# Patient Record
Sex: Male | Born: 1990 | Hispanic: No | Smoking: Current every day smoker
Health system: Southern US, Community
[De-identification: ages and names within clinical notes are randomized; demographics above are authoritative.]

---

## 2016-08-02 ENCOUNTER — Emergency Department (HOSPITAL_COMMUNITY): Payer: Self-pay

## 2016-08-02 ENCOUNTER — Encounter (HOSPITAL_COMMUNITY): Payer: Self-pay | Admitting: Emergency Medicine

## 2016-08-02 ENCOUNTER — Emergency Department (HOSPITAL_COMMUNITY)
Admission: EM | Admit: 2016-08-02 | Discharge: 2016-08-03 | Disposition: A | Discharge status: Home / Self Care | Payer: Self-pay | Attending: Emergency Medicine | Admitting: Emergency Medicine

## 2016-08-02 DIAGNOSIS — W51XXXA Accidental striking against or bumped into by another person, initial encounter: Secondary | ICD-10-CM | POA: Insufficient documentation

## 2016-08-02 DIAGNOSIS — Y999 Unspecified external cause status: Secondary | ICD-10-CM | POA: Insufficient documentation

## 2016-08-02 DIAGNOSIS — F17200 Nicotine dependence, unspecified, uncomplicated: Secondary | ICD-10-CM | POA: Insufficient documentation

## 2016-08-02 DIAGNOSIS — Y92322 Soccer field as the place of occurrence of the external cause: Secondary | ICD-10-CM | POA: Insufficient documentation

## 2016-08-02 DIAGNOSIS — S42022A Displaced fracture of shaft of left clavicle, initial encounter for closed fracture: Secondary | ICD-10-CM | POA: Insufficient documentation

## 2016-08-02 DIAGNOSIS — Y9366 Activity, soccer: Secondary | ICD-10-CM | POA: Insufficient documentation

## 2016-08-02 DIAGNOSIS — S42002A Fracture of unspecified part of left clavicle, initial encounter for closed fracture: Secondary | ICD-10-CM

## 2016-08-02 IMAGING — DX DG CLAVICLE*L*
2 series · 2 of 2 positions shown · non-contrast
Comparison: None.

CLINICAL DATA: Fell today while playing soccer. Pain in the left
collarbone.

EXAM:
LEFT CLAVICLE - 2+ VIEWS

[clavicle ap]
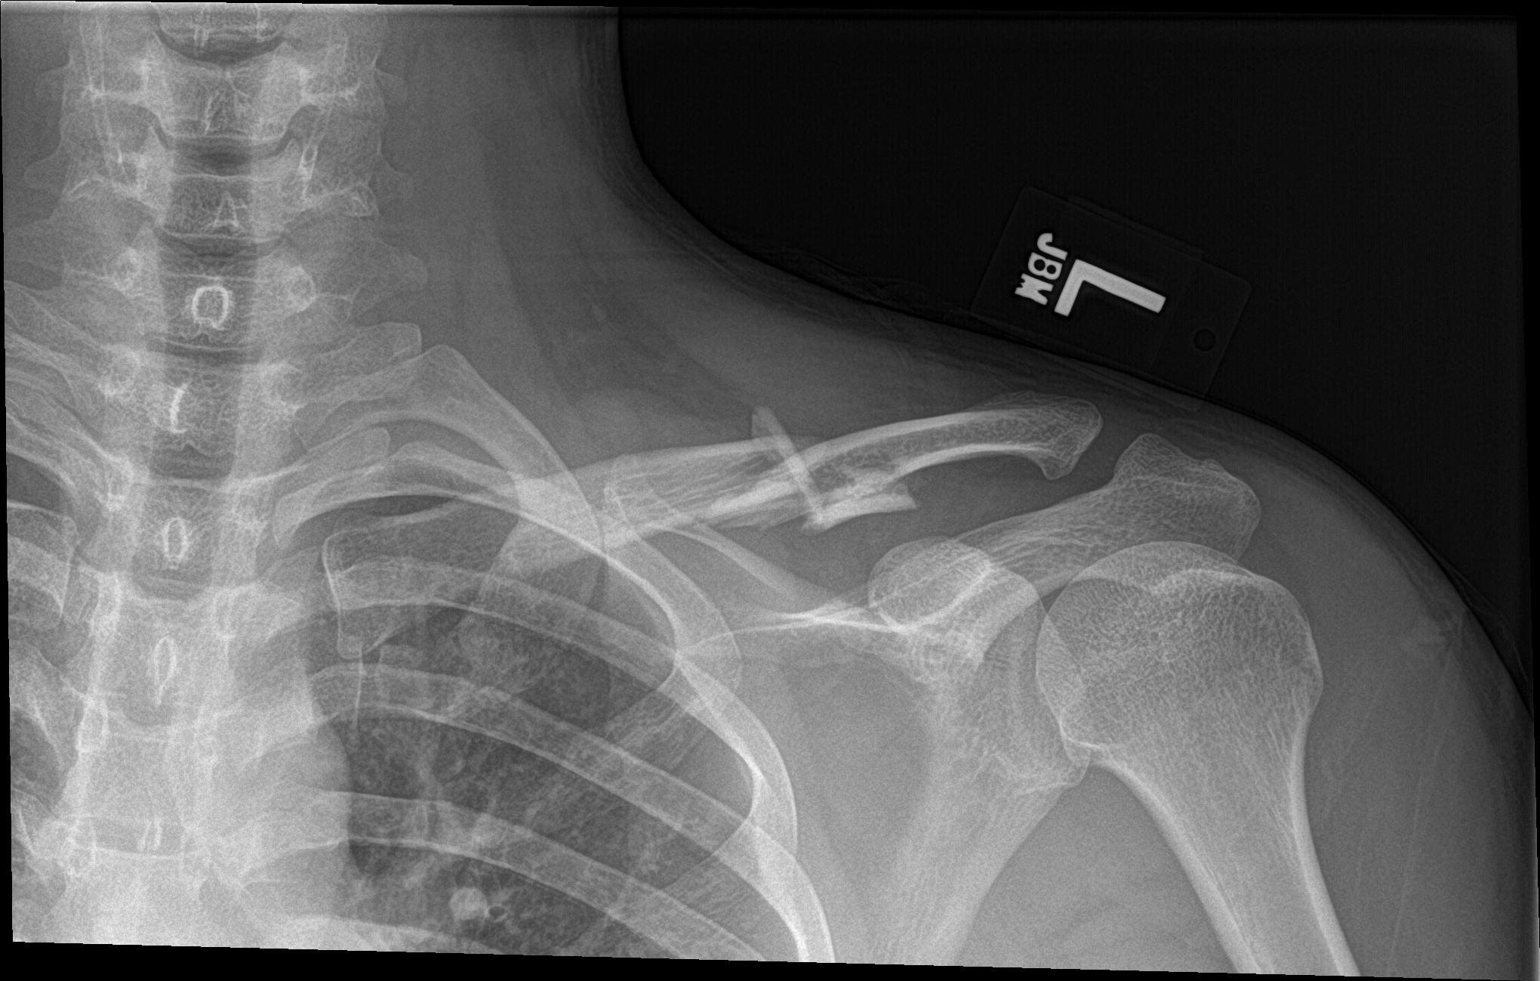

[clavicle axial]
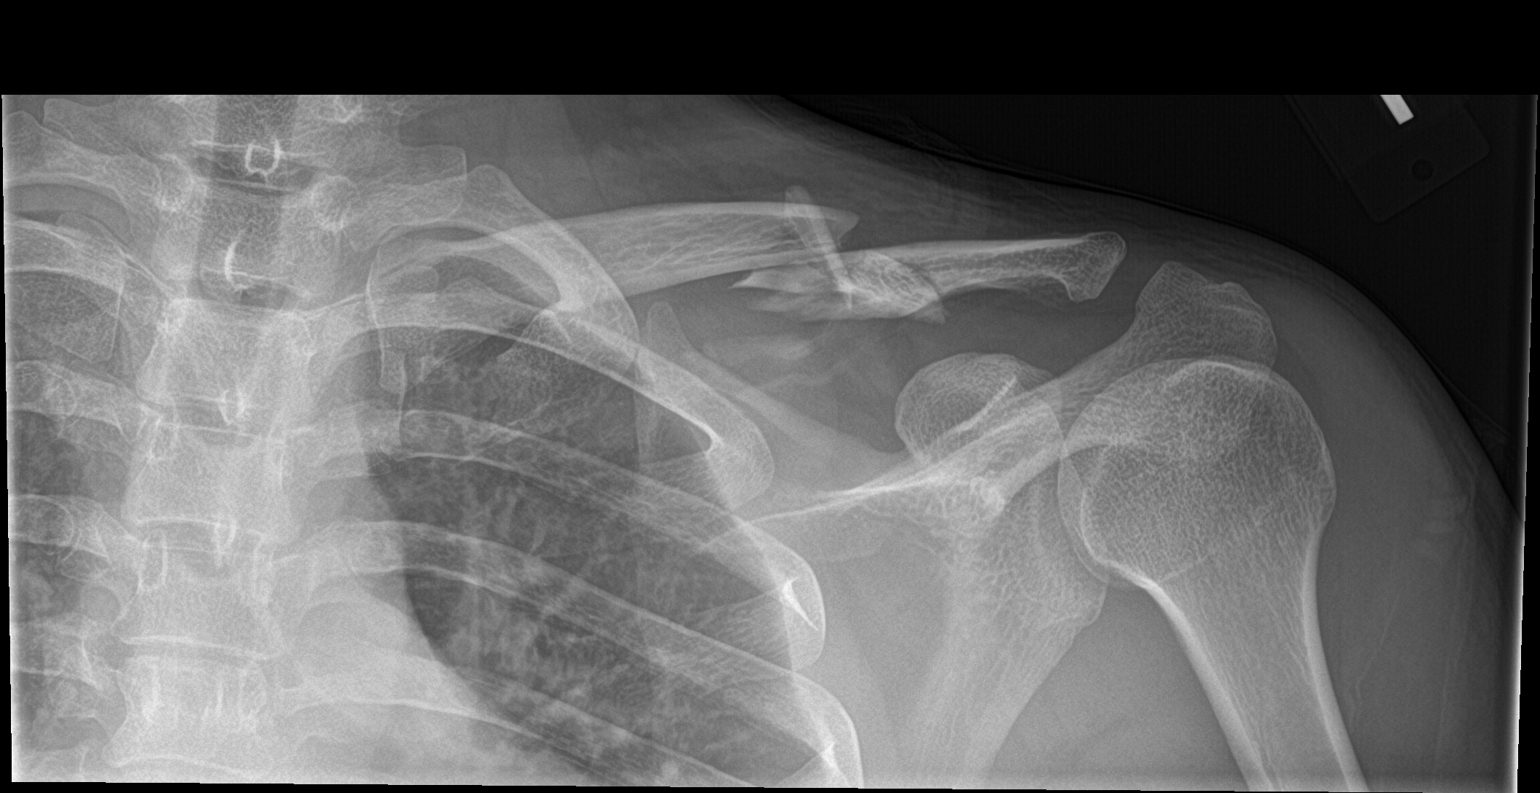

[2 of 2 positions shown; findings below may reference images not displayed]

FINDINGS: Comminuted fractures of the midshaft left clavicle with shaft width
inferior displacement of the distal fracture fragment and about 13
mm overriding. Slight widening of the acromioclavicular joint.
Coracoclavicular space appears maintained.
IMPRESSION: Comminuted fractures of the midshaft left clavicle with inferior
displacement and overriding. Slight widening of the
acromioclavicular joint may indicate ligamentous injury.

## 2016-08-02 NOTE — ED Triage Notes (Addendum)
Pt states someone ran into him while playing soccer 1 hour ago.   C/o L clavicle pain.

## 2016-08-03 ENCOUNTER — Emergency Department (HOSPITAL_COMMUNITY): Payer: Self-pay

## 2016-08-03 MED ORDER — ONDANSETRON HCL 4 MG/2ML IJ SOLN
4.0000 mg | Freq: Once | INTRAMUSCULAR | Status: AC
  Administered 2016-08-03: 4 mg via INTRAVENOUS
  Filled 2016-08-03: qty 2

## 2016-08-03 MED ORDER — HYDROCODONE-ACETAMINOPHEN 5-325 MG PO TABS: 1.0000 | ORAL_TABLET | Freq: Four times a day (QID) | ORAL | 0 refills | Status: AC | PRN

## 2016-08-03 MED ORDER — MORPHINE SULFATE (PF) 4 MG/ML IV SOLN
4.0000 mg | Freq: Once | INTRAVENOUS | Status: AC
  Administered 2016-08-03: 4 mg via INTRAVENOUS
  Filled 2016-08-03: qty 1

## 2016-08-03 MED ORDER — IBUPROFEN 600 MG PO TABS: 600.0000 mg | ORAL_TABLET | Freq: Four times a day (QID) | ORAL | 0 refills | Status: AC | PRN

## 2016-08-03 MED ORDER — IOPAMIDOL (ISOVUE-370) INJECTION 76%
INTRAVENOUS | Status: AC
  Administered 2016-08-03: 100 mL
  Filled 2016-08-03: qty 50

## 2016-08-03 NOTE — Discharge Instructions (Signed)
Apply ice several times a day. Avoid any activity with left arm, wear sling. Please follow-up with orthopedics specialist as referred

## 2016-08-03 NOTE — ED Notes (Signed)
Patient taken to CT.  No signs of distress noted at this time

## 2016-08-03 NOTE — ED Notes (Signed)
Patient Alert and oriented X4. Stable and ambulatory. Patient verbalized understanding of the discharge instructions.  Patient belongings were taken by the patient.  

## 2016-08-03 NOTE — ED Provider Notes (Signed)
[facility]-EMERGENCY DEPT Provider Note   CSN: [id number] Arrival date & time: 08/02/16  2041     History   Chief Complaint Chief Complaint  Patient presents with  . Other    Clavicle pain    HPI Erik Fuller is a 25 y.o. male.  HPI Erik Fuller is a 25 y.o. male with no medical problems, presents to emergency department complaining of left clavicle injury. Patient states he was playing soccer, states another player pushed him from the back and he fell forward onto his shoulder. He reports pain and swelling to the clavicle area. He denies any other injuries. He did not hit his head or lost consciousness. He denies any pain in his elbow, wrist, hand. He denies any prior injuries to the arm. No treatment prior to coming in.  History reviewed. No pertinent past medical history.  There are no active problems to display for this patient.   History reviewed. No pertinent surgical history.     Home Medications    Prior to Admission medications   Not on File    Family History No family history on file.  Social History Social History  Substance Use Topics  . Smoking status: Current Every Day Smoker  . Smokeless tobacco: Never Used  . Alcohol use Yes     Allergies   Review of patient's allergies indicates not on file.   Review of Systems Review of Systems  Constitutional: Negative for chills and fever.  Respiratory: Negative for cough, chest tightness and shortness of breath.   Cardiovascular: Negative for chest pain, palpitations and leg swelling.  Gastrointestinal: Negative for abdominal distention, abdominal pain, diarrhea, nausea and vomiting.  Genitourinary: Negative for urgency.  Musculoskeletal: Positive for arthralgias and joint swelling. Negative for myalgias, neck pain and neck stiffness.  Skin: Negative for rash.  Allergic/Immunologic: Negative for immunocompromised state.  Neurological: Negative for weakness, numbness and headaches.  All other systems reviewed and  are negative.    Physical Exam Updated Vital Signs BP 129/86 (BP Location: Right Arm)   Pulse 90   Temp 98.3 F (36.8 C) (Oral)   Resp 14   Ht 5\' 1"  (1.549 m)   Wt 63.6 kg   SpO2 99%   BMI 26.51 kg/m   Physical Exam  Constitutional: He appears well-developed and well-nourished. No distress.  HENT:  Head: Normocephalic and atraumatic.  Eyes: Conjunctivae are normal.  Neck: Neck supple.  Cardiovascular: Normal rate, regular rhythm and normal heart sounds.   Pulmonary/Chest: Effort normal. No respiratory distress. He has no wheezes. He has no rales.  Musculoskeletal: He exhibits no edema.  Swelling over left clavicle, tenderness to palpation diffusely. No point tenderness over anterior posterior shoulder joint. Normal elbow and wrist. Hand is normal with full range of motion of all fingers. Decreased left distal radial pulse compared to the right, but still faintly palpable. Hand is pink and warm, cap refill less than 2 seconds.  Neurological: He is alert.  Skin: Skin is warm and dry.  Nursing note and vitals reviewed.    ED Treatments / Results  Labs (all labs ordered are listed, but only abnormal results are displayed) Labs Reviewed - No data to display  EKG  EKG Interpretation None       Radiology Dg Clavicle Left  Result Date: 08/02/2016 CLINICAL DATA:  Larey Seat today while playing soccer. Pain in the left collarbone. EXAM: LEFT CLAVICLE - 2+ VIEWS COMPARISON:  None. FINDINGS: Comminuted fractures of the midshaft left clavicle with shaft width  inferior displacement of the distal fracture fragment and about 13 mm overriding. Slight widening of the acromioclavicular joint. Coracoclavicular space appears maintained. IMPRESSION: Comminuted fractures of the midshaft left clavicle with inferior displacement and overriding. Slight widening of the acromioclavicular joint may indicate ligamentous injury. Electronically Signed   By: Burman NievesWilliam  Stevens M.D.   On: 08/02/2016 21:46    Ct Angio Neck W And/or Wo Contrast  Result Date: 08/03/2016 CLINICAL DATA:  LEFT clavicle injury, assess for arterial injury. EXAM: CT ANGIOGRAPHY NECK TECHNIQUE: Multidetector CT imaging of the neck was performed using the standard protocol during bolus administration of intravenous contrast. Multiplanar CT image reconstructions and MIPs were obtained to evaluate the vascular anatomy. Carotid stenosis measurements (when applicable) are obtained utilizing NASCET criteria, using the distal internal carotid diameter as the denominator. CONTRAST:  100 cc Isovue 370 COMPARISON:  LEFT clavicle radiograph August 02, 2016 FINDINGS: Aortic arch: Normal appearance of the thoracic arch, normal branch pattern. The origins of the innominate, left Common carotid artery and subclavian artery are widely patent. No arterial injury with particular attention to LEFT subclavian artery. Right carotid system: Common carotid artery is widely patent, coursing in a straight line fashion. Normal appearance of the carotid bifurcation without hemodynamically significant stenosis by NASCET criteria. Normal appearance of the included internal carotid artery. Left carotid system: Common carotid artery is widely patent, coursing in a straight line fashion. Normal appearance of the carotid bifurcation without hemodynamically significant stenosis by NASCET criteria. Normal appearance of the included internal carotid artery. Vertebral arteries:RIGHT vertebral artery is dominant. Normal appearance of the vertebral arteries, which appear widely patent. Skeleton: Comminuted mildly angulated LEFT mid clavicle fracture. Other neck: LEFT supraclavicular soft tissue swelling, subcutaneous fat stranding compatible with hemorrhage. IMPRESSION: No acute vascular injury; negative CTA NECK. Acute LEFT mid clavicle fracture and subcutaneous hemorrhage/contusion. Electronically Signed   By: Awilda Metroourtnay  Bloomer M.D.   On: 08/03/2016 02:34     Procedures Procedures (including critical care time)  Medications Ordered in ED Medications - No data to display   Initial Impression / Assessment and Plan / ED Course  I have reviewed the triage vital signs and the nursing notes.  Pertinent labs & imaging results that were available during my care of the patient were reviewed by me and considered in my medical decision making (see chart for details).  Clinical Course  Comment By Time  Patient with left clavicle injury after fall. On exam noted the left distal radial pulses decreased, especially compared to the right. It is still faintly palpable and hand is pink and warm. I'm concerned about possible subclavian artery injury. Will CT patient's subclavian area for further evaluation. I spoke with radiologist, regarding which type of imaging to order, advised to order CT angiography neck with contrast Jaynie Crumbleatyana Imani Sherrin, PA-C 08/26 0101   CT agnio negative for vascular injury. Strength intact. Dc home with sling, ibuprofen, norco, close follow up with orthopedics. Return precautions discussed.   Vitals:   08/02/16 2054 08/03/16 0307  BP: 129/86 132/88  Pulse: 90 92  Resp: 14 16  Temp: 98.3 F (36.8 C)   TempSrc: Oral   SpO2: 99% 100%  Weight: 63.6 kg   Height: 5\' 1"  (1.549 m)      Final Clinical Impressions(s) / ED Diagnoses   Final diagnoses:  Clavicle fracture, left, closed, initial encounter    New Prescriptions Discharge Medication List as of 08/03/2016  2:50 AM    START taking these medications   Details  HYDROcodone-acetaminophen (  NORCO) 5-325 MG tablet Take 1 tablet by mouth every 6 (six) hours as needed for moderate pain., Starting Sat 08/03/2016, Print    ibuprofen (ADVIL,MOTRIN) 600 MG tablet Take 1 tablet (600 mg total) by mouth every 6 (six) hours as needed., Starting Sat 08/03/2016, Print         Jaynie Crumble, PA-C 08/03/16 0444    Bethann Berkshire, MD 08/03/16 1228

## 2016-08-03 NOTE — ED Notes (Signed)
Patient comes after having injury to the left clavicle.  Patient states he took no medications for pain prior to coming here.  Upon assessment there are decreased pulses and temperature to the lower left extremity.  PA made aware and plan to have CT angio done.  Will continue to monitor closely

## 2016-08-03 NOTE — ED Notes (Signed)
Patient returned from CT
